# Patient Record
Sex: Female | Born: 1960 | Race: Black or African American | Hispanic: No | Marital: Single | Smoking: Never smoker
Health system: Southern US, Community
[De-identification: ages and names within clinical notes are randomized; demographics above are authoritative.]

## PROBLEM LIST (undated history)

## (undated) DIAGNOSIS — G43909 Migraine, unspecified, not intractable, without status migrainosus: Secondary | ICD-10-CM

## (undated) HISTORY — PX: BREAST SURGERY: SHX581

---

## 1999-11-09 ENCOUNTER — Emergency Department (HOSPITAL_COMMUNITY): Admission: EM | Admit: 1999-11-09 | Discharge: 1999-11-09 | Payer: Self-pay | Admitting: *Deleted

## 1999-11-15 ENCOUNTER — Ambulatory Visit (HOSPITAL_COMMUNITY): Admission: RE | Admit: 1999-11-15 | Discharge: 1999-11-15 | Payer: Self-pay | Admitting: Family Medicine

## 1999-11-15 ENCOUNTER — Encounter: Payer: Self-pay | Admitting: Family Medicine

## 1999-11-28 ENCOUNTER — Encounter: Payer: Self-pay | Admitting: Family Medicine

## 1999-11-28 ENCOUNTER — Encounter: Admission: RE | Admit: 1999-11-28 | Discharge: 1999-11-28 | Payer: Self-pay | Admitting: Family Medicine

## 2000-04-26 ENCOUNTER — Emergency Department (HOSPITAL_COMMUNITY): Admission: EM | Admit: 2000-04-26 | Discharge: 2000-04-26 | Payer: Self-pay | Admitting: Emergency Medicine

## 2000-04-26 ENCOUNTER — Encounter: Payer: Self-pay | Admitting: Emergency Medicine

## 2001-09-10 ENCOUNTER — Emergency Department (HOSPITAL_COMMUNITY): Admission: EM | Admit: 2001-09-10 | Discharge: 2001-09-10 | Payer: Self-pay | Admitting: Emergency Medicine

## 2001-09-10 ENCOUNTER — Encounter: Payer: Self-pay | Admitting: Emergency Medicine

## 2004-05-14 ENCOUNTER — Ambulatory Visit (HOSPITAL_COMMUNITY): Admission: RE | Admit: 2004-05-14 | Discharge: 2004-05-14 | Payer: Self-pay | Admitting: Family Medicine

## 2004-05-16 ENCOUNTER — Ambulatory Visit (HOSPITAL_COMMUNITY): Admission: RE | Admit: 2004-05-16 | Discharge: 2004-05-16 | Payer: Self-pay | Admitting: Family Medicine

## 2005-08-06 ENCOUNTER — Other Ambulatory Visit: Admission: RE | Admit: 2005-08-06 | Discharge: 2005-08-06 | Payer: Self-pay | Admitting: Family Medicine

## 2006-03-13 ENCOUNTER — Encounter: Admission: RE | Admit: 2006-03-13 | Discharge: 2006-06-11 | Payer: Self-pay | Admitting: Family Medicine

## 2006-09-03 ENCOUNTER — Encounter: Admission: RE | Admit: 2006-09-03 | Discharge: 2006-10-02 | Payer: Self-pay | Admitting: Family Medicine

## 2006-12-16 ENCOUNTER — Ambulatory Visit (HOSPITAL_COMMUNITY): Admission: RE | Admit: 2006-12-16 | Discharge: 2006-12-16 | Payer: Self-pay | Admitting: Sports Medicine

## 2008-06-07 ENCOUNTER — Emergency Department (HOSPITAL_COMMUNITY): Admission: EM | Admit: 2008-06-07 | Discharge: 2008-06-07 | Payer: Self-pay | Admitting: Family Medicine

## 2008-07-13 ENCOUNTER — Emergency Department (HOSPITAL_COMMUNITY): Admission: EM | Admit: 2008-07-13 | Discharge: 2008-07-13 | Payer: Self-pay | Admitting: Emergency Medicine

## 2008-10-11 ENCOUNTER — Emergency Department (HOSPITAL_COMMUNITY): Admission: EM | Admit: 2008-10-11 | Discharge: 2008-10-11 | Payer: Self-pay | Admitting: Emergency Medicine

## 2009-03-29 ENCOUNTER — Emergency Department (HOSPITAL_COMMUNITY): Admission: EM | Admit: 2009-03-29 | Discharge: 2009-03-29 | Payer: Self-pay | Admitting: Emergency Medicine

## 2009-10-05 ENCOUNTER — Other Ambulatory Visit: Admission: RE | Admit: 2009-10-05 | Discharge: 2009-10-05 | Payer: Self-pay | Admitting: Family Medicine

## 2009-10-29 ENCOUNTER — Emergency Department (HOSPITAL_COMMUNITY): Admission: EM | Admit: 2009-10-29 | Discharge: 2009-10-29 | Payer: Self-pay | Admitting: Emergency Medicine

## 2010-02-28 ENCOUNTER — Encounter: Admission: RE | Admit: 2010-02-28 | Discharge: 2010-05-29 | Payer: Self-pay | Admitting: Family Medicine

## 2010-06-14 ENCOUNTER — Encounter: Admission: RE | Admit: 2010-06-14 | Discharge: 2010-06-14 | Payer: Self-pay | Admitting: Family Medicine

## 2010-06-17 ENCOUNTER — Emergency Department (HOSPITAL_COMMUNITY): Admission: EM | Admit: 2010-06-17 | Discharge: 2010-06-17 | Payer: Self-pay | Admitting: Emergency Medicine

## 2010-08-14 ENCOUNTER — Encounter
Admission: RE | Admit: 2010-08-14 | Discharge: 2010-08-14 | Payer: Self-pay | Source: Home / Self Care | Attending: Family Medicine | Admitting: Family Medicine

## 2010-10-16 ENCOUNTER — Encounter
Admission: RE | Admit: 2010-10-16 | Discharge: 2010-12-04 | Payer: Self-pay | Source: Home / Self Care | Attending: Family Medicine | Admitting: Family Medicine

## 2010-10-20 ENCOUNTER — Emergency Department (HOSPITAL_COMMUNITY)
Admission: EM | Admit: 2010-10-20 | Discharge: 2010-10-20 | Payer: Self-pay | Source: Home / Self Care | Admitting: Emergency Medicine

## 2010-11-25 ENCOUNTER — Encounter: Payer: Self-pay | Admitting: Family Medicine

## 2010-11-29 ENCOUNTER — Other Ambulatory Visit: Payer: Self-pay | Admitting: Obstetrics and Gynecology

## 2010-11-29 DIAGNOSIS — Z1239 Encounter for other screening for malignant neoplasm of breast: Secondary | ICD-10-CM

## 2010-12-12 ENCOUNTER — Encounter: Payer: MEDICARE | Attending: Family Medicine | Admitting: *Deleted

## 2010-12-26 ENCOUNTER — Ambulatory Visit
Admission: RE | Admit: 2010-12-26 | Discharge: 2010-12-26 | Disposition: A | Discharge status: Home / Self Care | Payer: MEDICARE | Source: Ambulatory Visit | Attending: *Deleted | Admitting: *Deleted

## 2010-12-26 DIAGNOSIS — Z1239 Encounter for other screening for malignant neoplasm of breast: Secondary | ICD-10-CM

## 2011-02-04 ENCOUNTER — Encounter: Payer: MEDICARE | Attending: Family Medicine | Admitting: *Deleted

## 2011-11-18 ENCOUNTER — Other Ambulatory Visit: Payer: Self-pay | Admitting: Family Medicine

## 2011-11-18 DIAGNOSIS — Z1231 Encounter for screening mammogram for malignant neoplasm of breast: Secondary | ICD-10-CM

## 2011-12-30 ENCOUNTER — Ambulatory Visit
Admission: RE | Admit: 2011-12-30 | Discharge: 2011-12-30 | Disposition: A | Discharge status: Home / Self Care | Payer: Medicare Other | Source: Ambulatory Visit | Attending: Family Medicine | Admitting: Family Medicine

## 2011-12-30 DIAGNOSIS — Z1231 Encounter for screening mammogram for malignant neoplasm of breast: Secondary | ICD-10-CM

## 2012-06-09 ENCOUNTER — Emergency Department (HOSPITAL_COMMUNITY)
Admission: EM | Admit: 2012-06-09 | Discharge: 2012-06-09 | Disposition: A | Discharge status: Home / Self Care | Payer: Medicare Other | Attending: Emergency Medicine | Admitting: Emergency Medicine

## 2012-06-09 ENCOUNTER — Encounter (HOSPITAL_COMMUNITY): Payer: Self-pay | Admitting: *Deleted

## 2012-06-09 DIAGNOSIS — K029 Dental caries, unspecified: Secondary | ICD-10-CM | POA: Insufficient documentation

## 2012-06-09 DIAGNOSIS — K047 Periapical abscess without sinus: Secondary | ICD-10-CM | POA: Insufficient documentation

## 2012-06-09 MED ORDER — PENICILLIN V POTASSIUM 500 MG PO TABS: 500.0000 mg | ORAL_TABLET | Freq: Four times a day (QID) | ORAL | Status: AC

## 2012-06-09 MED ORDER — CLINDAMYCIN PHOSPHATE 900 MG/50ML IV SOLN
900.0000 mg | Freq: Once | INTRAVENOUS | Status: AC
  Administered 2012-06-09: 900 mg via INTRAVENOUS
  Filled 2012-06-09: qty 50

## 2012-06-09 MED ORDER — KETOROLAC TROMETHAMINE 15 MG/ML IJ SOLN
15.0000 mg | Freq: Once | INTRAMUSCULAR | Status: AC
  Administered 2012-06-09: 15 mg via INTRAVENOUS
  Filled 2012-06-09: qty 1

## 2012-06-09 MED ORDER — TRAMADOL HCL 50 MG PO TABS: 50.0000 mg | ORAL_TABLET | Freq: Four times a day (QID) | ORAL | Status: AC | PRN

## 2012-06-09 MED ORDER — SODIUM CHLORIDE 0.9 % IV SOLN
Freq: Once | INTRAVENOUS | Status: AC
  Administered 2012-06-09: 07:00:00 via INTRAVENOUS

## 2012-06-09 NOTE — ED Provider Notes (Signed)
History     CSN: [id number]  Arrival date & time 06/09/12  0518   First MD Initiated Contact with Patient 06/09/12 [phone]      Chief Complaint  Patient presents with  . Dental Pain    (Consider location/radiation/quality/duration/timing/severity/associated sxs/prior treatment) HPI Comments: Patient states, that she noticed some right upper jaw discomfort.  Last night before she went to bed.  She took some Advil.  I was able to sleep until about 3:00 in the morning, when she was awakened, when she rolled over with right cheek pain.  She looked in the mirror and she had quite swollen.  Face.  She states she can feel the "pus" running down her throat.  Denies fever.  Does not have a dentist  Patient is a 51 y.o. female presenting with tooth pain. The history is provided by the patient.  Dental PainThe primary symptoms include mouth pain. Primary symptoms do not include headaches, fever or sore throat. The symptoms began yesterday. The symptoms are worsening. The symptoms occur constantly.  Additional symptoms include: gum swelling and facial swelling.    History reviewed. No pertinent past medical history.  History reviewed. No pertinent past surgical history.  No family history on file.  History  Substance Use Topics  . Smoking status: Never Smoker   . Smokeless tobacco: Not on file  . Alcohol Use: No    OB History    Grav Para Term Preterm Abortions TAB SAB Ect Mult Living                  Review of Systems  Constitutional: Negative for fever and chills.  HENT: Positive for facial swelling and dental problem. Negative for sore throat and neck pain.   Gastrointestinal: Negative for nausea.  Neurological: Negative for dizziness and headaches.    Allergies  Review of patient's allergies indicates no known allergies.  Home Medications   Current Outpatient Rx  Name Route Sig Dispense Refill  . PENICILLIN V POTASSIUM 500 MG PO TABS Oral Take 1 tablet (500 mg total) by  mouth 4 (four) times daily. 40 tablet 0  . TRAMADOL HCL 50 MG PO TABS Oral Take 1 tablet (50 mg total) by mouth every 6 (six) hours as needed for pain. 15 tablet 0    BP 124/85  Pulse 64  Temp 97.9 F (36.6 C) (Oral)  Resp 18  SpO2 99%  LMP 11/25/2010  Physical Exam  Constitutional: She is oriented to person, place, and time. She appears well-developed and well-nourished.  HENT:  Head: Normocephalic.  Mouth/Throat: Dental abscesses and dental caries present. No uvula swelling.    Cardiovascular: Normal rate.   Pulmonary/Chest: Effort normal.  Musculoskeletal: Normal range of motion.  Neurological: She is alert and oriented to person, place, and time.  Skin: Skin is warm. No erythema.    ED Course  Procedures (including critical care time)  Labs Reviewed - No data to display No results found.   1. Dental abscess       MDM   Patient has a dental abscess.  I will give IV clindamycin 900 mg and Toradol, IV, 30 mg I would refer patient to Dr. Leanord Asal for further dental care        Arman Filter, NP 06/10/12 0147  Arman Filter, NP 06/10/12 1191

## 2012-06-09 NOTE — ED Notes (Signed)
Pt escorted to discharge window, in no distress. 

## 2012-06-09 NOTE — ED Notes (Signed)
Pt c/o right sided jaw swelling since last night; states feels like upper right jaw but not having a lot of pain but can feel drainage

## 2012-06-09 NOTE — ED Provider Notes (Signed)
PT signed out to me at shift change. Pt with right lower wisdom tooth pain, swelling to the right cheek that started over night. Pt receiving IV antibiotics for possible abscess. Plan to d/c home with follow up with a dentist/oral surgery.  I examined pt, pt with decay of the right lower 3rd molar, swelling to the right cheek. NO obvious drainable abscess.Marland Kitchen Pt finished 900mg  IV clindamycin. Follow up with a dentist/oral surgery.  Lottie Mussel, [place] 06/09/12 [phone]

## 2012-06-09 NOTE — ED Notes (Signed)
Pt sts that she awoke this morning at approximately 0400 with tooth pain in the right upper side of her mouth. Pt sts that she took 2 advil at approximately midnight. Patient face swollen on her right side. Patient sts she has no dentist at this time. Pain 5/10.

## 2012-06-10 NOTE — ED Provider Notes (Signed)
Medical screening examination/treatment/procedure(s) were performed by non-physician practitioner and as supervising physician I was immediately available for consultation/collaboration.   Celisse Ciulla L Brinkley Peet, MD 06/10/12 0740 

## 2012-11-25 ENCOUNTER — Other Ambulatory Visit: Payer: Self-pay | Admitting: Family Medicine

## 2012-12-25 ENCOUNTER — Emergency Department (HOSPITAL_COMMUNITY)
Admission: EM | Admit: 2012-12-25 | Discharge: 2012-12-25 | Disposition: A | Discharge status: Home / Self Care | Payer: No Typology Code available for payment source | Attending: Emergency Medicine | Admitting: Emergency Medicine

## 2012-12-25 DIAGNOSIS — IMO0002 Reserved for concepts with insufficient information to code with codable children: Secondary | ICD-10-CM | POA: Insufficient documentation

## 2012-12-25 DIAGNOSIS — Y9389 Activity, other specified: Secondary | ICD-10-CM | POA: Insufficient documentation

## 2012-12-25 DIAGNOSIS — S8390XA Sprain of unspecified site of unspecified knee, initial encounter: Secondary | ICD-10-CM

## 2012-12-25 DIAGNOSIS — S335XXA Sprain of ligaments of lumbar spine, initial encounter: Secondary | ICD-10-CM | POA: Insufficient documentation

## 2012-12-25 DIAGNOSIS — Y9229 Other specified public building as the place of occurrence of the external cause: Secondary | ICD-10-CM | POA: Insufficient documentation

## 2012-12-25 MED ORDER — METHOCARBAMOL 500 MG PO TABS: 500.0000 mg | ORAL_TABLET | Freq: Two times a day (BID) | ORAL | Status: DC

## 2012-12-25 MED ORDER — OXYCODONE-ACETAMINOPHEN 5-325 MG PO TABS
2.0000 | ORAL_TABLET | Freq: Once | ORAL | Status: AC
  Administered 2012-12-25: 2 via ORAL
  Filled 2012-12-25: qty 2

## 2012-12-25 MED ORDER — HYDROCODONE-ACETAMINOPHEN 5-325 MG PO TABS: 2.0000 | ORAL_TABLET | ORAL | Status: DC | PRN

## 2012-12-25 MED ORDER — NAPROXEN 500 MG PO TABS: 500.0000 mg | ORAL_TABLET | Freq: Two times a day (BID) | ORAL | Status: DC
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
## 2012-12-25 NOTE — ED Notes (Signed)
Ortho tech at bedside 

## 2012-12-26 NOTE — ED Provider Notes (Signed)
Medical screening examination/treatment/procedure(s) were conducted as a shared visit with non-physician practitioner(s) and myself.  I personally evaluated the patient during the encounter  Derwood Kaplan, MD 12/26/12 2342

## 2013-06-05 ENCOUNTER — Emergency Department (HOSPITAL_COMMUNITY)
Admission: EM | Admit: 2013-06-05 | Discharge: 2013-06-05 | Disposition: A | Discharge status: Home / Self Care | Payer: Medicare Other | Attending: Emergency Medicine | Admitting: Emergency Medicine

## 2013-06-05 ENCOUNTER — Encounter (HOSPITAL_COMMUNITY): Payer: Self-pay | Admitting: Emergency Medicine

## 2013-06-05 DIAGNOSIS — S39012A Strain of muscle, fascia and tendon of lower back, initial encounter: Secondary | ICD-10-CM

## 2013-06-05 DIAGNOSIS — Y93B9 Activity, other involving muscle strengthening exercises: Secondary | ICD-10-CM | POA: Insufficient documentation

## 2013-06-05 DIAGNOSIS — Y9239 Other specified sports and athletic area as the place of occurrence of the external cause: Secondary | ICD-10-CM | POA: Insufficient documentation

## 2013-06-05 DIAGNOSIS — IMO0002 Reserved for concepts with insufficient information to code with codable children: Secondary | ICD-10-CM | POA: Insufficient documentation

## 2013-06-05 DIAGNOSIS — Z8679 Personal history of other diseases of the circulatory system: Secondary | ICD-10-CM | POA: Insufficient documentation

## 2013-06-05 DIAGNOSIS — X500XXA Overexertion from strenuous movement or load, initial encounter: Secondary | ICD-10-CM | POA: Insufficient documentation

## 2013-06-05 HISTORY — DX: Migraine, unspecified, not intractable, without status migrainosus: G43.909

## 2013-06-05 MED ORDER — HYDROCODONE-ACETAMINOPHEN 5-325 MG PO TABS: 2.0000 | ORAL_TABLET | ORAL | Status: AC | PRN
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
## 2015-02-22 DIAGNOSIS — D443 Neoplasm of uncertain behavior of pituitary gland: Secondary | ICD-10-CM | POA: Diagnosis not present

## 2015-05-24 DIAGNOSIS — S40011A Contusion of right shoulder, initial encounter: Secondary | ICD-10-CM | POA: Diagnosis not present

## 2015-06-19 DIAGNOSIS — D352 Benign neoplasm of pituitary gland: Secondary | ICD-10-CM | POA: Diagnosis not present

## 2015-06-19 DIAGNOSIS — Z0000 Encounter for general adult medical examination without abnormal findings: Secondary | ICD-10-CM | POA: Diagnosis not present

## 2015-08-22 DIAGNOSIS — D497 Neoplasm of unspecified behavior of endocrine glands and other parts of nervous system: Secondary | ICD-10-CM | POA: Diagnosis not present

## 2015-08-22 DIAGNOSIS — D443 Neoplasm of uncertain behavior of pituitary gland: Secondary | ICD-10-CM | POA: Diagnosis not present

## 2015-08-24 DIAGNOSIS — D352 Benign neoplasm of pituitary gland: Secondary | ICD-10-CM | POA: Diagnosis not present

## 2015-08-24 DIAGNOSIS — E221 Hyperprolactinemia: Secondary | ICD-10-CM | POA: Diagnosis not present

## 2015-10-26 DIAGNOSIS — Z0000 Encounter for general adult medical examination without abnormal findings: Secondary | ICD-10-CM | POA: Diagnosis not present

## 2015-10-26 DIAGNOSIS — Z6841 Body Mass Index (BMI) 40.0 and over, adult: Secondary | ICD-10-CM | POA: Diagnosis not present

## 2015-10-26 DIAGNOSIS — R030 Elevated blood-pressure reading, without diagnosis of hypertension: Secondary | ICD-10-CM | POA: Diagnosis not present

## 2016-01-15 DIAGNOSIS — Z1231 Encounter for screening mammogram for malignant neoplasm of breast: Secondary | ICD-10-CM | POA: Diagnosis not present

## 2016-08-30 DIAGNOSIS — E221 Hyperprolactinemia: Secondary | ICD-10-CM | POA: Diagnosis not present

## 2016-08-30 DIAGNOSIS — Z6841 Body Mass Index (BMI) 40.0 and over, adult: Secondary | ICD-10-CM | POA: Diagnosis not present

## 2016-08-30 DIAGNOSIS — D352 Benign neoplasm of pituitary gland: Secondary | ICD-10-CM | POA: Diagnosis not present

## 2016-09-03 DIAGNOSIS — Z0000 Encounter for general adult medical examination without abnormal findings: Secondary | ICD-10-CM | POA: Diagnosis not present

## 2016-09-03 DIAGNOSIS — E669 Obesity, unspecified: Secondary | ICD-10-CM | POA: Diagnosis not present

## 2016-09-03 DIAGNOSIS — M1990 Unspecified osteoarthritis, unspecified site: Secondary | ICD-10-CM | POA: Diagnosis not present

## 2016-11-26 DIAGNOSIS — Z0000 Encounter for general adult medical examination without abnormal findings: Secondary | ICD-10-CM | POA: Diagnosis not present

## 2016-11-26 DIAGNOSIS — E559 Vitamin D deficiency, unspecified: Secondary | ICD-10-CM | POA: Diagnosis not present

## 2016-11-26 DIAGNOSIS — N390 Urinary tract infection, site not specified: Secondary | ICD-10-CM | POA: Diagnosis not present

## 2016-11-26 DIAGNOSIS — E221 Hyperprolactinemia: Secondary | ICD-10-CM | POA: Diagnosis not present

## 2016-11-26 DIAGNOSIS — Z1321 Encounter for screening for nutritional disorder: Secondary | ICD-10-CM | POA: Diagnosis not present

## 2016-12-03 DIAGNOSIS — D352 Benign neoplasm of pituitary gland: Secondary | ICD-10-CM | POA: Diagnosis not present

## 2016-12-03 DIAGNOSIS — Z0000 Encounter for general adult medical examination without abnormal findings: Secondary | ICD-10-CM | POA: Diagnosis not present

## 2016-12-03 DIAGNOSIS — E221 Hyperprolactinemia: Secondary | ICD-10-CM | POA: Diagnosis not present

## 2017-01-06 DIAGNOSIS — Z1212 Encounter for screening for malignant neoplasm of rectum: Secondary | ICD-10-CM | POA: Diagnosis not present

## 2017-01-06 DIAGNOSIS — Z1211 Encounter for screening for malignant neoplasm of colon: Secondary | ICD-10-CM | POA: Diagnosis not present

## 2017-01-21 DIAGNOSIS — E559 Vitamin D deficiency, unspecified: Secondary | ICD-10-CM | POA: Diagnosis not present

## 2017-01-24 DIAGNOSIS — Z1231 Encounter for screening mammogram for malignant neoplasm of breast: Secondary | ICD-10-CM | POA: Diagnosis not present

## 2017-04-21 ENCOUNTER — Emergency Department (HOSPITAL_COMMUNITY): Payer: Medicare HMO

## 2017-04-21 ENCOUNTER — Emergency Department (HOSPITAL_COMMUNITY)
Admission: EM | Admit: 2017-04-21 | Discharge: 2017-04-21 | Disposition: A | Discharge status: Home / Self Care | Payer: Medicare HMO | Attending: Emergency Medicine | Admitting: Emergency Medicine

## 2017-04-21 ENCOUNTER — Encounter (HOSPITAL_COMMUNITY): Payer: Self-pay | Admitting: Emergency Medicine

## 2017-04-21 DIAGNOSIS — Y939 Activity, unspecified: Secondary | ICD-10-CM | POA: Diagnosis not present

## 2017-04-21 DIAGNOSIS — M25551 Pain in right hip: Secondary | ICD-10-CM | POA: Diagnosis not present

## 2017-04-21 DIAGNOSIS — Y929 Unspecified place or not applicable: Secondary | ICD-10-CM | POA: Diagnosis not present

## 2017-04-21 DIAGNOSIS — Y999 Unspecified external cause status: Secondary | ICD-10-CM | POA: Diagnosis not present

## 2017-04-21 DIAGNOSIS — X500XXA Overexertion from strenuous movement or load, initial encounter: Secondary | ICD-10-CM | POA: Diagnosis not present

## 2017-04-21 DIAGNOSIS — Z79899 Other long term (current) drug therapy: Secondary | ICD-10-CM | POA: Diagnosis not present

## 2017-04-21 DIAGNOSIS — S79911A Unspecified injury of right hip, initial encounter: Secondary | ICD-10-CM | POA: Diagnosis present

## 2017-04-21 MED ORDER — MELOXICAM 7.5 MG PO TABS: 7.5000 mg | ORAL_TABLET | Freq: Every day | ORAL | 0 refills | Status: AC

## 2017-04-21 NOTE — Discharge Instructions (Signed)
Please read instructions below. You can take meloxicam, with food, once per day for pain and swelling. Call within 24 hours to schedule an appointment with the orthopedic specialist to follow up on your hip pain. Return to the ER for new or concerning symptoms.

## 2017-04-21 NOTE — ED Triage Notes (Signed)
Pt states she is having problems with her right hip   Pt states when she stands she feels like she has to move around to get her hip right where she can walk  Pt states she feels like her leg is swollen from her knee up   Onset of sxs 3 weeks ago  Denies injury

## 2017-04-21 NOTE — ED Provider Notes (Signed)
[facility] DEPT Provider Note   CSN: [id number] Arrival date & time: 04/21/17  2542     History   Chief Complaint Chief Complaint  Patient presents with  . Hip Pain    HPI Latoya Evans is a 56 y.o. female.  Pt presents w gradually onset worsening R hip pain that began about 3 weeks ago. Pt reports pain is located in groin, and her hip is stiff initially with ambulation. She reports assoc swelling that reaches to her knee. Reports multiple home treatments without relief, including advil, foam roller, topical analgesics, tens therapy, ice, heat, etc. Pt states she go to the gym daily. No inciting injuries to her hip. Denies N/T, bowel or bladder incontinence, fever, other symptoms.      Past Medical History:  Diagnosis Date  . Migraines     There are no active problems to display for this patient.   Past Surgical History:  Procedure Laterality Date  . BREAST SURGERY     reduction    OB History    No data available       Home Medications    Prior to Admission medications   Medication Sig Start Date End Date Taking? Authorizing Provider  calcium-vitamin D (CALCIUM 500/D) 500-200 MG-UNIT tablet Take 1 tablet by mouth daily.   Yes [provider]  naproxen sodium (ANAPROX) 220 MG tablet Take 440 mg by mouth daily as needed.   Yes [provider]  HYDROcodone-acetaminophen (NORCO) 5-325 MG per tablet Take 2 tablets by mouth every 4 (four) hours as needed for pain. Patient not taking: Reported on 04/21/2017 06/05/13   Daleen Bo, MD  meloxicam (MOBIC) 7.5 MG tablet Take 1 tablet (7.5 mg total) by mouth daily. 04/21/17   Russo, [place] N, PA-C    Family History Family History  Problem Relation Age of Onset  . Diabetes Brother   . Hypertension Brother     Social History Social History  Substance Use Topics  . Smoking status: Never Smoker  . Smokeless tobacco: Never Used  . Alcohol use No     Allergies   Penicillins   Review of  Systems Review of Systems  Constitutional: Negative for fever.  Gastrointestinal:       No bowel incontinence  Genitourinary: Negative for difficulty urinating.  Musculoskeletal: Positive for arthralgias (R hip) and joint swelling. Negative for back pain.  Neurological: Negative for weakness and numbness.     Physical Exam Updated Vital Signs BP 118/80 (BP Location: Left Arm)   Pulse 68   Temp 97.8 F (36.6 C) (Oral)   Resp 18   SpO2 98%   Physical Exam  Constitutional: She appears well-developed and well-nourished. No distress.  Morbidly obese  HENT:  Head: Normocephalic and atraumatic.  Eyes: Conjunctivae are normal.  Cardiovascular: Normal rate and intact distal pulses.   Pulmonary/Chest: Effort normal.  Musculoskeletal:  TTP in R groin and medial thigh. Dec AROM of hip 2/t pain, 5/5 strength BLE with hip flexion, knee extension, dorsal and planter flexion. Sensation intact. Pt able to ambulate though favoring right leg.   Psychiatric: She has a normal mood and affect. Her behavior is normal.  Nursing note and vitals reviewed.    ED Treatments / Results  Labs (all labs ordered are listed, but only abnormal results are displayed) Labs Reviewed - No data to display  EKG  EKG Interpretation None       Radiology Dg Hip Unilat With Pelvis 2-3 Views Right  Result Date:  04/21/2017 CLINICAL DATA:  Right hip pain beginning 3 weeks ago after lifting. Initial encounter. EXAM: DG HIP (WITH OR WITHOUT PELVIS) 2-3V RIGHT COMPARISON:  None. FINDINGS: There is no evidence of hip fracture or dislocation. There is no evidence of arthropathy or other focal bone abnormality. IMPRESSION: Negative. Electronically Signed   By: San Morelle M.D.   On: 04/21/2017 08:09    Procedures Procedures (including critical care time)  Medications Ordered in ED Medications - No data to display   Initial Impression / Assessment and Plan / ED Course  I have reviewed the triage vital  signs and the nursing notes.  Pertinent labs & imaging results that were available during my care of the patient were reviewed by me and considered in my medical decision making (see chart for details).     Pt w R hip pain x3weeks. No inciting injuries. Xray negative for acute pathology. Pt is NV intact, VSS, and able to ambulate without assistance. Will send with meloxicam and orthopedic referral for follow up on symptoms. Pt safe for discharge.  Patient discussed with Dr. Venora Maples.  Discussed results, findings, treatment and follow up. Patient advised of return precautions. Patient verbalized understanding and agreed with plan.   Final Clinical Impressions(s) / ED Diagnoses   Final diagnoses:  Right hip pain    New Prescriptions New Prescriptions   MELOXICAM (MOBIC) 7.5 MG TABLET    Take 1 tablet (7.5 mg total) by mouth daily.     Russo, [place] N, PA-C 04/21/17 6861    Jola Schmidt, MD 04/21/17 1011

## 2017-04-21 NOTE — ED Notes (Signed)
Patient transported to X-ray 

## 2017-05-13 DIAGNOSIS — M7061 Trochanteric bursitis, right hip: Secondary | ICD-10-CM | POA: Diagnosis not present

## 2017-05-13 DIAGNOSIS — M25551 Pain in right hip: Secondary | ICD-10-CM | POA: Diagnosis not present

## 2017-09-09 DIAGNOSIS — D352 Benign neoplasm of pituitary gland: Secondary | ICD-10-CM | POA: Diagnosis not present

## 2017-09-09 DIAGNOSIS — E221 Hyperprolactinemia: Secondary | ICD-10-CM | POA: Diagnosis not present

## 2018-03-25 ENCOUNTER — Emergency Department (HOSPITAL_COMMUNITY)
Admission: EM | Admit: 2018-03-25 | Discharge: 2018-03-25 | Disposition: A | Discharge status: Home / Self Care | Payer: Medicare HMO | Attending: Emergency Medicine | Admitting: Emergency Medicine

## 2018-03-25 ENCOUNTER — Encounter (HOSPITAL_COMMUNITY): Payer: Self-pay | Admitting: Emergency Medicine

## 2018-03-25 ENCOUNTER — Emergency Department (HOSPITAL_COMMUNITY): Payer: Medicare HMO

## 2018-03-25 ENCOUNTER — Other Ambulatory Visit: Payer: Self-pay

## 2018-03-25 DIAGNOSIS — M1712 Unilateral primary osteoarthritis, left knee: Secondary | ICD-10-CM | POA: Insufficient documentation

## 2018-03-25 DIAGNOSIS — M25562 Pain in left knee: Secondary | ICD-10-CM | POA: Diagnosis present

## 2018-03-25 DIAGNOSIS — Z79899 Other long term (current) drug therapy: Secondary | ICD-10-CM | POA: Diagnosis not present

## 2018-03-25 MED ORDER — DICLOFENAC SODIUM 1 % TD GEL: 4.0000 g | Freq: Four times a day (QID) | TRANSDERMAL | 0 refills | Status: AC

## 2018-03-25 NOTE — Discharge Instructions (Addendum)
Continue all your current medications.  You can try Voltaren gel topically for pain relief.  Ice and elevate your knee.  Follow-up with your family doctor scheduled.

## 2018-03-25 NOTE — ED Triage Notes (Signed)
Pt complaint of left knee pain for 2 weeks; cannot recall injury.
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
## 2019-05-01 IMAGING — CR DG KNEE COMPLETE 4+V*L*
4 series · 4 of 4 positions shown · non-contrast
Comparison: MRI 12/16/2006

CLINICAL DATA: Left knee pain for 2 weeks

EXAM:
LEFT KNEE - COMPLETE 4+ VIEW

[t knee ap left]
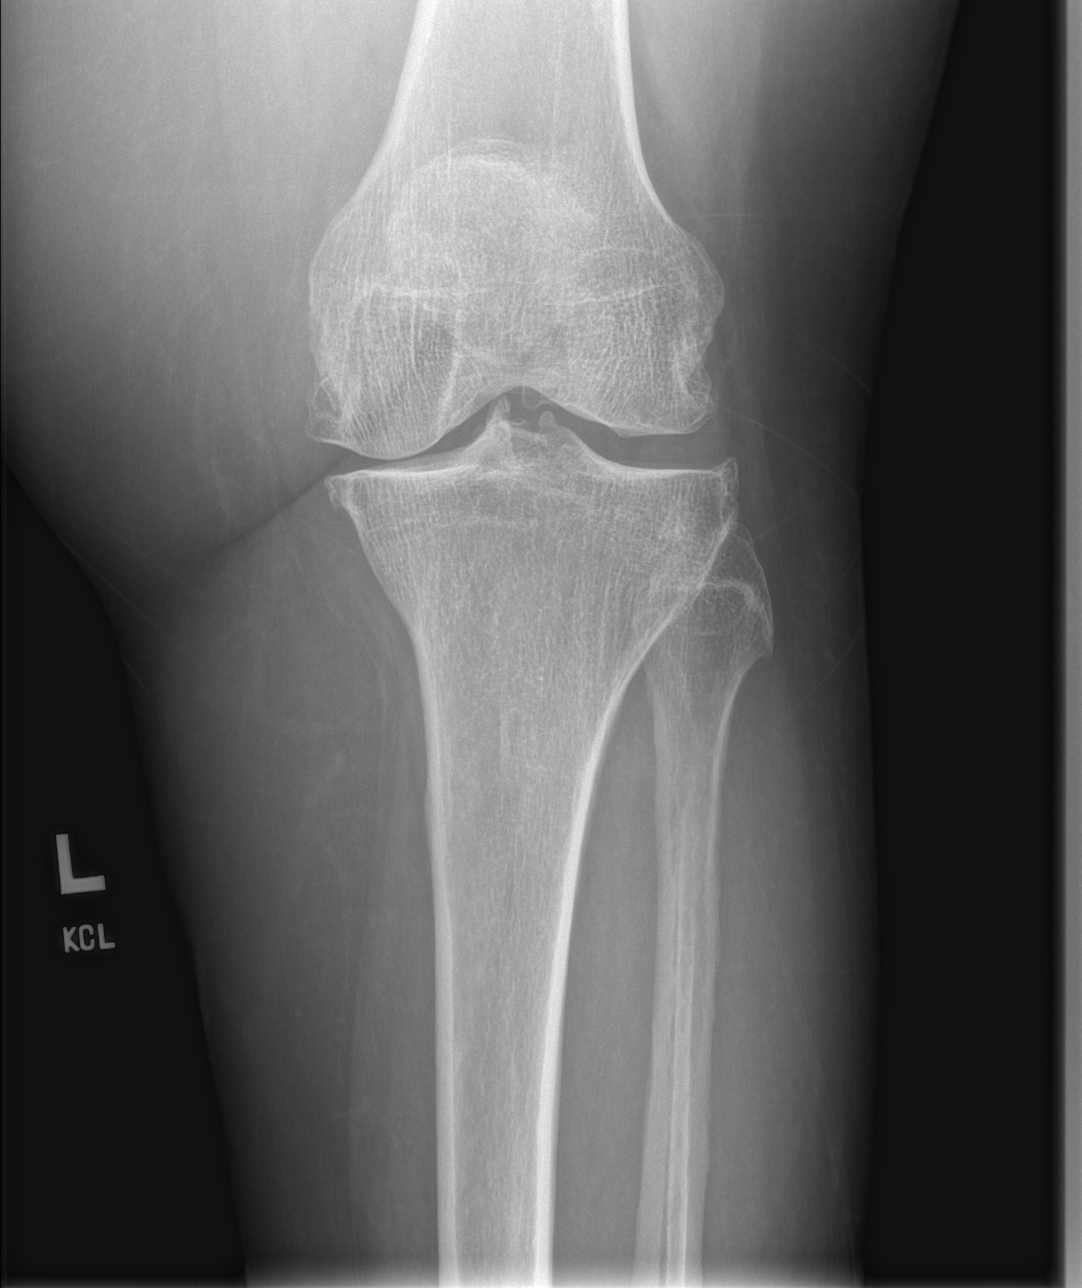

[t knee obl left (1 of 2)]
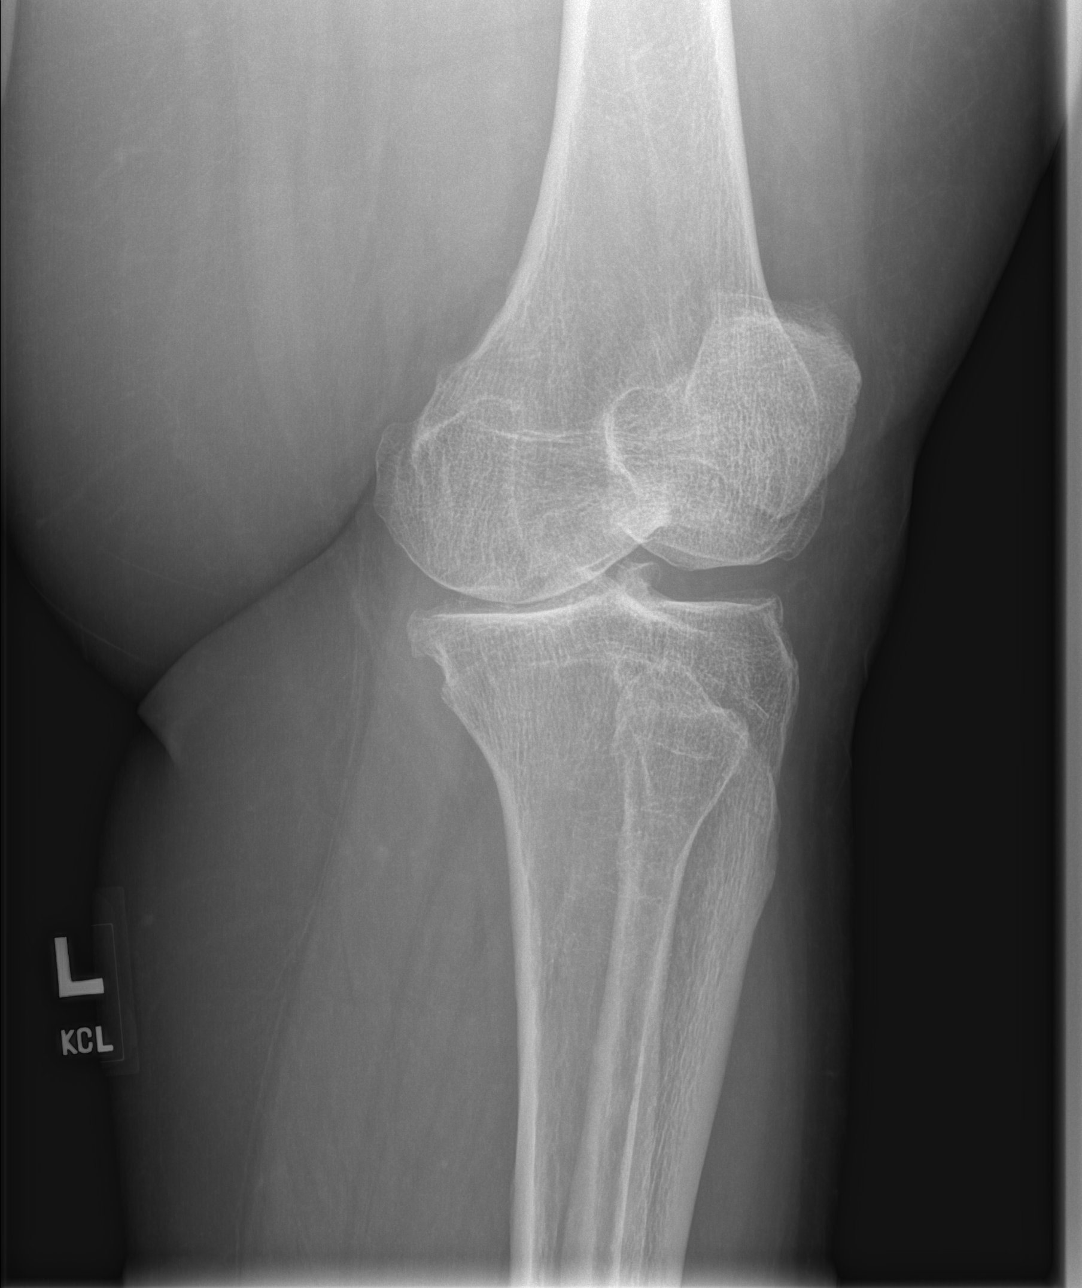

[t knee obl left (2 of 2)]
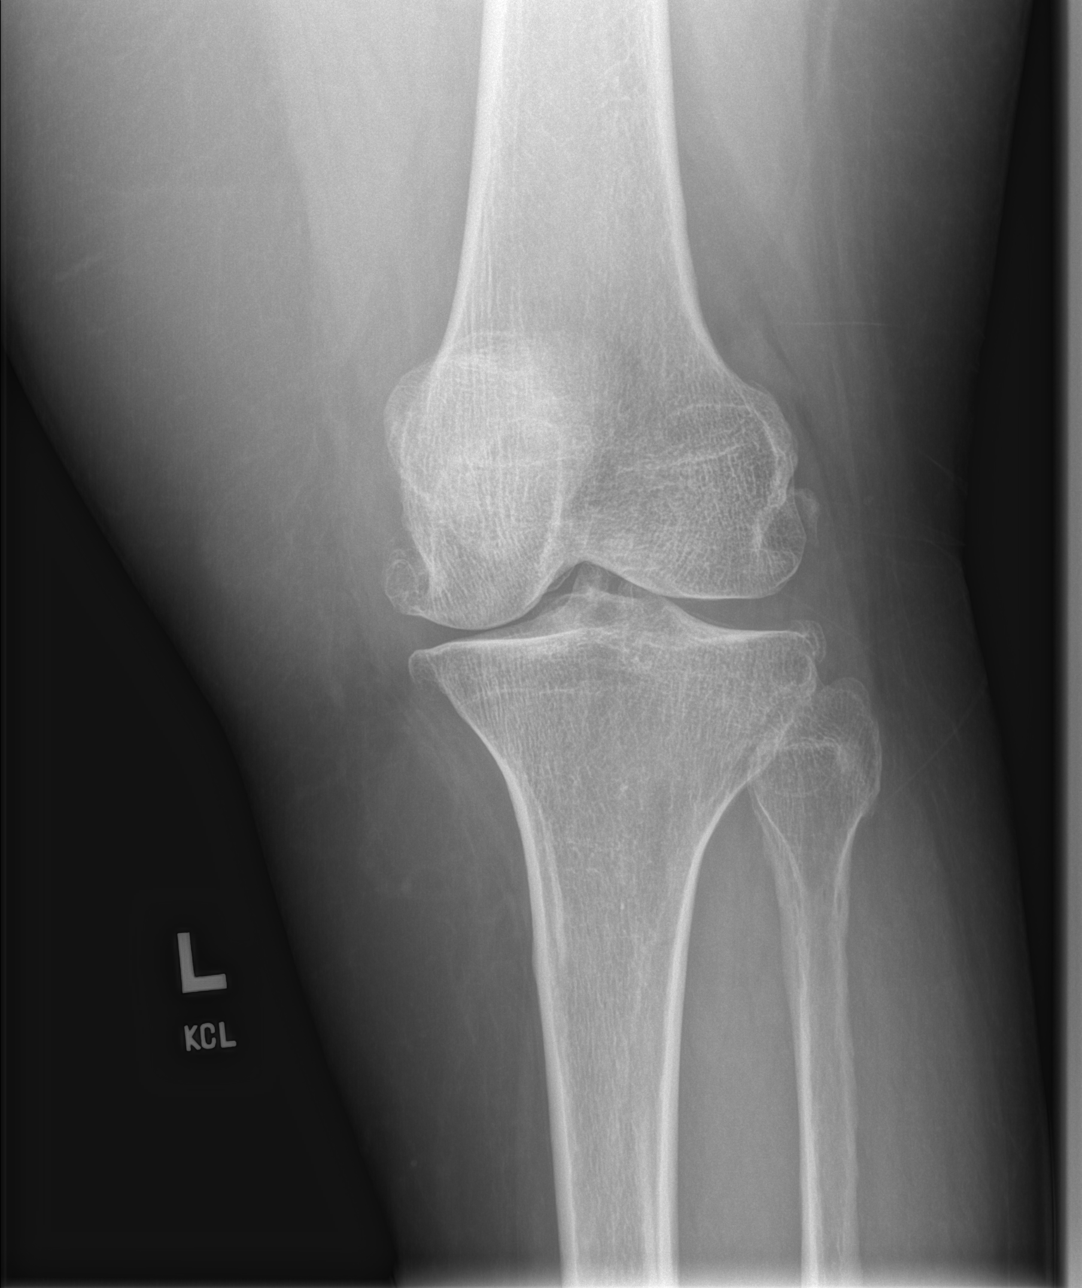

[t knee lat left]
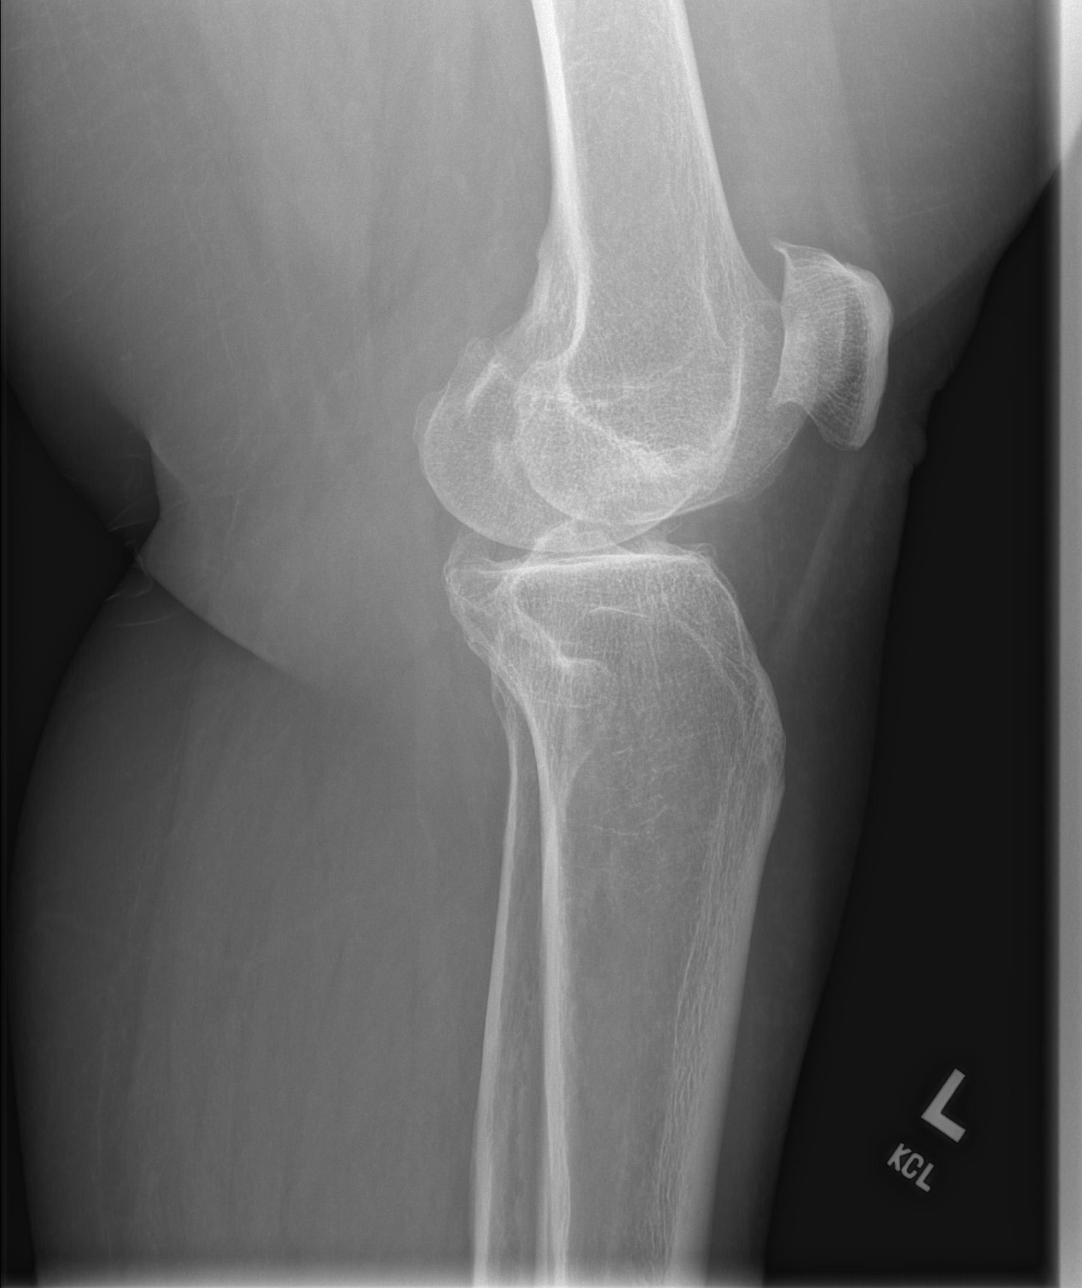

[4 of 4 positions shown; findings below may reference images not displayed]

FINDINGS: Tricompartmental osteoarthritis, more markedly affecting the medial
femorotibial compartment is noted with joint space narrowing and
spurring. No acute fracture, joint effusion or dislocation. No
suspicious osseous lesions. No soft tissue mass or mineralization.
IMPRESSION: Tricompartmental osteoarthritis without acute osseous abnormality.

## 2021-07-30 ENCOUNTER — Encounter (HOSPITAL_COMMUNITY): Payer: Self-pay | Admitting: Emergency Medicine

## 2021-07-30 ENCOUNTER — Other Ambulatory Visit: Payer: Self-pay

## 2021-07-30 ENCOUNTER — Emergency Department (HOSPITAL_COMMUNITY)
Admission: EM | Admit: 2021-07-30 | Discharge: 2021-07-30 | Disposition: A | Discharge status: Home / Self Care | Payer: Medicare HMO | Attending: Emergency Medicine | Admitting: Emergency Medicine

## 2021-07-30 ENCOUNTER — Emergency Department (HOSPITAL_COMMUNITY): Payer: Medicare HMO

## 2021-07-30 DIAGNOSIS — M542 Cervicalgia: Secondary | ICD-10-CM | POA: Insufficient documentation

## 2021-07-30 DIAGNOSIS — M1711 Unilateral primary osteoarthritis, right knee: Secondary | ICD-10-CM | POA: Diagnosis not present

## 2021-07-30 DIAGNOSIS — Y92410 Unspecified street and highway as the place of occurrence of the external cause: Secondary | ICD-10-CM | POA: Diagnosis not present

## 2021-07-30 DIAGNOSIS — M546 Pain in thoracic spine: Secondary | ICD-10-CM | POA: Diagnosis not present

## 2021-07-30 DIAGNOSIS — M25561 Pain in right knee: Secondary | ICD-10-CM | POA: Diagnosis present

## 2021-07-30 DIAGNOSIS — M545 Low back pain, unspecified: Secondary | ICD-10-CM | POA: Insufficient documentation

## 2021-07-30 MED ORDER — IBUPROFEN 200 MG PO TABS
600.0000 mg | ORAL_TABLET | Freq: Once | ORAL | Status: AC
  Administered 2021-07-30: 600 mg via ORAL
  Filled 2021-07-30: qty 3

## 2021-07-30 MED ORDER — CYCLOBENZAPRINE HCL 10 MG PO TABS: 10.0000 mg | ORAL_TABLET | Freq: Two times a day (BID) | ORAL | 0 refills | Status: AC | PRN

## 2021-07-30 MED ORDER — CYCLOBENZAPRINE HCL 10 MG PO TABS: 10.0000 mg | ORAL_TABLET | Freq: Two times a day (BID) | ORAL | 0 refills | Status: DC | PRN

## 2021-07-30 NOTE — Discharge Instructions (Addendum)
Return for any problem.  Use ice, ibuprofen, rest as instructed.   Use prescribed muscle relaxant if needed.
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
## 2022-04-05 ENCOUNTER — Other Ambulatory Visit: Payer: Self-pay | Admitting: Family Medicine

## 2022-04-05 DIAGNOSIS — Z1231 Encounter for screening mammogram for malignant neoplasm of breast: Secondary | ICD-10-CM

## 2022-04-30 ENCOUNTER — Ambulatory Visit: Payer: Medicare HMO

## 2022-05-04 ENCOUNTER — Encounter (HOSPITAL_COMMUNITY): Payer: Self-pay

## 2022-05-04 ENCOUNTER — Emergency Department (HOSPITAL_COMMUNITY)
Admission: EM | Admit: 2022-05-04 | Discharge: 2022-05-04 | Disposition: A | Discharge status: Home / Self Care | Payer: Medicare HMO | Attending: Emergency Medicine | Admitting: Emergency Medicine

## 2022-05-04 ENCOUNTER — Other Ambulatory Visit: Payer: Self-pay

## 2022-05-04 DIAGNOSIS — T148XXA Other injury of unspecified body region, initial encounter: Secondary | ICD-10-CM

## 2022-05-04 DIAGNOSIS — M1990 Unspecified osteoarthritis, unspecified site: Secondary | ICD-10-CM | POA: Diagnosis not present

## 2022-05-04 DIAGNOSIS — R109 Unspecified abdominal pain: Secondary | ICD-10-CM | POA: Diagnosis not present

## 2022-05-04 DIAGNOSIS — M545 Low back pain, unspecified: Secondary | ICD-10-CM | POA: Diagnosis not present

## 2022-05-04 DIAGNOSIS — M25551 Pain in right hip: Secondary | ICD-10-CM | POA: Diagnosis present

## 2022-05-04 LAB — CBC
HCT: 41.3 % (ref 36.0–46.0)
Hemoglobin: 12.5 g/dL (ref 12.0–15.0)
MCH: 23.4 pg — ABNORMAL LOW (ref 26.0–34.0)
MCHC: 30.3 g/dL (ref 30.0–36.0)
MCV: 77.3 fL — ABNORMAL LOW (ref 80.0–100.0)
Platelets: 345 10*3/uL (ref 150–400)
RBC: 5.34 MIL/uL — ABNORMAL HIGH (ref 3.87–5.11)
RDW: 17 % — ABNORMAL HIGH (ref 11.5–15.5)
WBC: 6.3 10*3/uL (ref 4.0–10.5)
nRBC: 0 % (ref 0.0–0.2)

## 2022-05-04 LAB — BASIC METABOLIC PANEL
Anion gap: 7 (ref 5–15)
BUN: 15 mg/dL (ref 6–20)
CO2: 27 mmol/L (ref 22–32)
Calcium: 9 mg/dL (ref 8.9–10.3)
Chloride: 108 mmol/L (ref 98–111)
Creatinine, Ser: 0.92 mg/dL (ref 0.44–1.00)
GFR, Estimated: >60 mL/min (ref >60)
Glucose, Bld: 114 mg/dL — ABNORMAL HIGH (ref 70–99)
Potassium: 3.9 mmol/L (ref 3.5–5.1)
Sodium: 142 mmol/L (ref 135–145)

## 2022-05-04 MED ORDER — PREDNISONE 20 MG PO TABS: 20.0000 mg | ORAL_TABLET | Freq: Two times a day (BID) | ORAL | 0 refills | Status: AC

## 2022-05-04 MED ORDER — DIAZEPAM 5 MG PO TABS: 5.0000 mg | ORAL_TABLET | Freq: Four times a day (QID) | ORAL | 0 refills | Status: AC | PRN

## 2022-05-04 MED ORDER — DIAZEPAM 5 MG PO TABS: 5.0000 mg | ORAL_TABLET | Freq: Three times a day (TID) | ORAL | 0 refills | Status: DC | PRN

## 2022-05-04 MED ORDER — TRAMADOL HCL 50 MG PO TABS: 50.0000 mg | ORAL_TABLET | Freq: Four times a day (QID) | ORAL | 0 refills | Status: AC | PRN

## 2022-05-04 NOTE — ED Provider Notes (Signed)
[facility] DEPT Provider Note   CSN: [id number] Arrival date & time: 05/04/22  1508     History {Add pertinent medical, surgical, social history, OB history to HPI:1} No chief complaint on file.   Latoya Evans is a 61 y.o. female.  HPI She presents for evaluation of pain in the right flank, right lumbar area, right hip, starting after water aerobics.  She is using ibuprofen, with partial relief.  She has history of degenerative joint disease of the knees.  She thinks she has arthritis in her back and bursitis in her right hip.  She denies weakness, paresthesias, nausea or vomiting.    Home Medications Prior to Admission medications   Medication Sig Start Date End Date Taking? Authorizing Provider  calcium-vitamin D (CALCIUM 500/D) 500-200 MG-UNIT tablet Take 1 tablet by mouth daily.    [provider]  cyclobenzaprine (FLEXERIL) 10 MG tablet Take 1 tablet (10 mg total) by mouth 2 (two) times daily as needed for muscle spasms. 07/30/21   Valarie Merino, MD  diclofenac sodium (VOLTAREN) 1 % GEL Apply 4 g topically 4 (four) times daily. 03/25/18   Kirichenko, Lahoma Rocker, PA-C  HYDROcodone-acetaminophen (NORCO) 5-325 MG per tablet Take 2 tablets by mouth every 4 (four) hours as needed for pain. Patient not taking: Reported on 04/21/2017 06/05/13   Daleen Bo, MD  meloxicam (MOBIC) 7.5 MG tablet Take 1 tablet (7.5 mg total) by mouth daily. 04/21/17   Robinson, [place] N, PA-C  naproxen sodium (ANAPROX) 220 MG tablet Take 440 mg by mouth daily as needed.    [provider]      Allergies    Penicillins    Review of Systems   Review of Systems  Physical Exam Updated Vital Signs BP (!) 147/93   Pulse 72   Temp 98.7 F (37.1 C) (Oral)   Resp 18   Ht '5\' 10"'$  (1.778 m)   Wt (!) 139.3 kg   SpO2 98%   BMI 44.05 kg/m  Physical Exam Vitals and nursing note reviewed.  Constitutional:      General: She is not in acute distress.     Appearance: She is well-developed. She is obese. She is not ill-appearing or diaphoretic.  HENT:     Head: Normocephalic and atraumatic.     Right Ear: External ear normal.     Left Ear: External ear normal.  Eyes:     Conjunctiva/sclera: Conjunctivae normal.     Pupils: Pupils are equal, round, and reactive to light.  Neck:     Trachea: Phonation normal.  Cardiovascular:     Rate and Rhythm: Normal rate.  Pulmonary:     Effort: Pulmonary effort is normal.  Abdominal:     General: There is no distension.     Tenderness: There is no abdominal tenderness.  Musculoskeletal:        General: Normal range of motion.     Cervical back: Normal range of motion and neck supple.     Comments: No altered motion of back or legs.  Mild tenderness of the right hip.  Nontender lumbar area.  Skin:    General: Skin is warm and dry.  Neurological:     Mental Status: She is alert and oriented to person, place, and time.     Cranial Nerves: No cranial nerve deficit.     Sensory: No sensory deficit.     Motor: No abnormal muscle tone.     Coordination: Coordination normal.  Psychiatric:        Mood and Affect: Mood normal.        Behavior: Behavior normal.        Thought Content: Thought content normal.        Judgment: Judgment normal.     ED Results / Procedures / Treatments   Labs (all labs ordered are listed, but only abnormal results are displayed) Labs Reviewed  CBC - Abnormal; Notable for the following components:      Result Value   RBC 5.34 (*)    MCV 77.3 (*)    MCH 23.4 (*)    RDW 17.0 (*)    All other components within normal limits  BASIC METABOLIC PANEL - Abnormal; Notable for the following components:   Glucose, Bld 114 (*)    All other components within normal limits    EKG None  Radiology No results found.  Procedures Procedures  {Document cardiac monitor, telemetry assessment procedure when appropriate:1}  Medications Ordered in ED Medications - No data to  display  ED Course/ Medical Decision Making/ A&P                           Medical Decision Making Musculoskeletal type pain without evidence for fracture or myelopathy.  No indication for imaging or further intervention from the ED setting.  Problems Addressed: Muscle strain: self-limited or minor problem Osteoarthritis, unspecified osteoarthritis type, unspecified site: chronic illness or injury with exacerbation, progression, or side effects of treatment  Risk Risk Details: Patient with overuse injury, after doing water aerobics several days ago.  She is ambulating, and has partial relief with ibuprofen.  Will give additional medication and instructions on gradually advancing activities.  She does not require hospitalization at this time.   ***  {Document critical care time when appropriate:1} {Document review of labs and clinical decision tools ie heart score, Chads2Vasc2 etc:1}  {Document your independent review of radiology images, and any outside records:1} {Document your discussion with family members, caretakers, and with consultants:1} {Document social determinants of health affecting pt's care:1} {Document your decision making why or why not admission, treatments were needed:1} Final Clinical Impression(s) / ED Diagnoses Final diagnoses:  None    Rx / DC Orders ED Discharge Orders     None

## 2022-05-04 NOTE — ED Triage Notes (Signed)
Pt reports right side of body hurting since yesterday. Pt reports it has gotten worse. Pt reports its swollen.

## 2022-05-04 NOTE — Discharge Instructions (Signed)
It is likely that you overdid it when you were in the pool the other day.  We are treating you with medication to treat for arthritis, muscle spasm and pain.  Use ice or heat on the sore areas 3-4 times a day which ever helps.  Rest is much as possible.  Avoid water aerobics for a week or 2.  See your doctor if not better in a few days.

## 2022-05-04 NOTE — ED Notes (Signed)
Pt d/c home with visitor per MD order, Discharge summary reviewed, verbalize understanding. Ambulatory off unit. No s/s of acute distress noted at discharge.

## 2022-05-04 NOTE — ED Provider Triage Note (Signed)
Emergency Medicine Provider Triage Evaluation Note  Latoya Evans , a 61 y.o. female  was evaluated in triage.  Pt complains of right-sided body swelling from her hip down to her ankle on the right side.  Patient states that she works out 5 days a week, believes that she recently overexerted herself at the gym which is when the "swelling" started.  On examination, the patient currently denies any swelling from her mid thigh down to her ankles.  Patient states that the swelling is located in her back and right hip.  Patient states that the "swelling" is worsened with movement.  Patient denies any recent fevers, nausea, vomiting, unilateral weakness or numbness, falls.  Review of Systems  Positive:  Negative:   Physical Exam  BP (!) 173/103 (BP Location: Left Arm)   Pulse 85   Temp 98.7 F (37.1 C) (Oral)   Resp 18   Ht '5\' 10"'$  (1.778 m)   Wt (!) 139.3 kg   SpO2 96%   BMI 44.05 kg/m  Gen:   Awake, no distress   Resp:  Normal effort  MSK:   Moves extremities without difficulty  Other:  No focal neurodeficits on examination neurologically.  Patient has 2+ DP pulse in the right foot.  Medical Decision Making  Medically screening exam initiated at 3:28 PM.  Appropriate orders placed.  Anette Guarneri was informed that the remainder of the evaluation will be completed by another provider, this initial triage assessment does not replace that evaluation, and the importance of remaining in the ED until their evaluation is complete.     Azucena Cecil, PA-C 05/04/22 1530

## 2023-04-01 ENCOUNTER — Telehealth: Payer: Self-pay

## 2023-04-01 NOTE — Telephone Encounter (Signed)
VMT pt requesting call back to discuss. Have referral to PREP for her and want to see what schedule she needs.

## 2023-04-10 NOTE — Progress Notes (Signed)
Met with Pt at [facility] Reviewed goals and discussed PREP Has started lifestyle changes however needs some help Have referral. Can do class starting 06/02/23 MW [id number] Intake scheduled for 05/26/23 at 930a Has my contact information. Will call her closer to start of class to remind

## 2023-05-26 NOTE — Progress Notes (Signed)
[facility] PREP Evaluation  Patient Details  Name: Latoya Evans MRN: [id number] Date of Birth: 1961-03-04 Age: 62 y.o. PCP: [facility]  Vitals:   05/26/23 0930  BP: 116/70  Pulse: 68  SpO2: 98%  Weight: (!) 303 lb 3.2 oz (137.5 kg)     [facility] Eval - 05/26/23 1000       [facility] "PREP" Location   [facility]" Location [facility]      Referral    Referring Provider Daye    Reason for referral Obesitity/Overweight    Program Start Date 06/02/23   [id number] MW x 12 wks     Measurement   Waist Circumference 60 inches   smallest measures 48   Hip Circumference 62.5 inches    Body fat 49.6 percent      Information for Trainer   Goals Get under 300 lbs, goal weight 240    Current Exercise Walks 25 min, pool 90 min 5 days per week    Orthopedic Concerns Arthritis, R knee and Hip pain    Pertinent Medical History none    Current Barriers none      Timed Up and Go (TUGS)   Timed Up and Go Moderate risk 10-12 seconds      Mobility and Daily Activities   I find it easy to walk up or down two or more flights of stairs. 2    I have no trouble taking out the trash. 4    I do housework such as vacuuming and dusting on my own without difficulty. 4    I can easily lift a gallon of milk (8lbs). 4    I can easily walk a mile. 4    I have no trouble reaching into high cupboards or reaching down to pick up something from the floor. 2    I do not have trouble doing out-door work such as Loss adjuster, chartered, raking leaves, or gardening. 4      Mobility and Daily Activities   I feel younger than my age. 4    I feel independent. 4    I feel energetic. 3    I live an active life.  4    I feel strong. 4    I feel healthy. 2    I feel active as other people my age. 2      How fit and strong are you.   Fit and Strong Total Score 47            Past Medical History:  Diagnosis Date   Migraines    Past Surgical History:  Procedure Laterality Date    BREAST SURGERY     reduction   Social History   Tobacco Use  Smoking Status Never  Smokeless Tobacco Never    Bonnye Fava 05/26/2023, 10:39 AM

## 2023-06-04 NOTE — Progress Notes (Signed)
[facility] PREP Weekly Session  Patient Details  Name: Latoya Evans MRN: [id number] Date of Birth: 06-17-1961 Age: 62 y.o. PCP: [facility]  There were no vitals filed for this visit.   [facility] Weekly seesion - 06/04/23 1500       [facility] "PREP" Location   [facility] "PREP" Location [facility]      Weekly Session   Topic Discussed Goal setting and welcome to the program   Fit tesing done   Classes attended to date 2             Bonnye Fava 06/04/2023, 3:44 PM

## 2023-06-10 NOTE — Progress Notes (Signed)
[facility] PREP Weekly Session  Patient Details  Name: Latoya Evans MRN: [id number] Date of Birth: 1961-07-05 Age: 62 y.o. PCP: [facility]  Vitals:   06/09/23 1030  Weight: (!) 302 lb 14.4 oz (137.4 kg)     [facility] Weekly seesion - 06/10/23 1100       [facility] "PREP" Location   [facility] "PREP" Location [facility]      Weekly Session   Topic Discussed Importance of resistance training;Other ways to be active    Minutes exercised this week 145 minutes    Classes attended to date 3             Bonnye Fava 06/10/2023, 11:44 AM

## 2023-06-19 NOTE — Progress Notes (Signed)
[facility] PREP Weekly Session  Patient Details  Name: Latoya Evans MRN: [id number] Date of Birth: July 20, 1961 Age: 62 y.o. PCP: [facility]  There were no vitals filed for this visit.   [facility] Weekly seesion - 06/19/23 1600       [facility] "PREP" Location   [facility] "PREP" Location [facility]      Weekly Session   Topic Discussed Healthy eating tips    Minutes exercised this week 195 minutes    Classes attended to date 5             Bonnye Fava 06/19/2023, 4:51 PM

## 2023-07-02 NOTE — Progress Notes (Signed)
[facility] PREP Weekly Session  Patient Details  Name: TATYANIA FAUROT MRN: [id number] Date of Birth: 16-Jul-1961 Age: 62 y.o. PCP: [facility]  Vitals:   06/30/23 1030  Weight: 299 lb (135.6 kg)     [facility] Weekly seesion - 07/02/23 1500       [facility] "PREP" Location   [facility]" [facility]      Weekly Session   Topic Discussed Restaurant Eating   Salt and sugar demos   Minutes exercised this week 180 minutes    Classes attended to date 8             Pam Jerral Bonito 07/02/2023, 3:07 PM

## 2023-08-04 NOTE — Progress Notes (Signed)
[facility] PREP Weekly Session  Patient Details  Name: Latoya Evans MRN: [id number] Date of Birth: 1961-07-10 Age: 62 y.o. PCP: [facility]  Vitals:   08/04/23 1030  Weight: 295 lb (133.8 kg)     [facility] Weekly seesion - 08/04/23 1300       [facility] "PREP" Location   [facility]PREP" Location [facility]      Weekly Session   Topic Discussed Eating for the season;Water   review of healthy habits   Minutes exercised this week 180 minutes    Classes attended to date 10             Bonnye Fava 08/04/2023, 1:27 PM

## 2023-09-24 NOTE — Progress Notes (Signed)
PREP 06/02/23 to 09/08/23 Attended a total of 12 sessions, 5 education.  Did all workouts on own.  Did not return for final fit testing, surveys, final measurements.
